# Patient Record
Sex: Female | Born: 1995 | Race: Black or African American | Hispanic: No | Marital: Single | Smoking: Never smoker
Health system: Southern US, Community
[De-identification: ages and names within clinical notes are randomized; demographics above are authoritative.]

## PROBLEM LIST (undated history)

## (undated) HISTORY — PX: INDUCED ABORTION: SHX677

---

## 2019-07-17 ENCOUNTER — Emergency Department (HOSPITAL_COMMUNITY)
Admission: EM | Admit: 2019-07-17 | Discharge: 2019-07-17 | Disposition: A | Discharge status: Home / Self Care | Payer: Self-pay | Attending: Emergency Medicine | Admitting: Emergency Medicine

## 2019-07-17 ENCOUNTER — Emergency Department (HOSPITAL_COMMUNITY): Payer: Self-pay

## 2019-07-17 ENCOUNTER — Other Ambulatory Visit: Payer: Self-pay

## 2019-07-17 ENCOUNTER — Encounter (HOSPITAL_COMMUNITY): Payer: Self-pay | Admitting: Emergency Medicine

## 2019-07-17 DIAGNOSIS — R110 Nausea: Secondary | ICD-10-CM | POA: Insufficient documentation

## 2019-07-17 DIAGNOSIS — T185XXA Foreign body in anus and rectum, initial encounter: Secondary | ICD-10-CM | POA: Insufficient documentation

## 2019-07-17 DIAGNOSIS — Y929 Unspecified place or not applicable: Secondary | ICD-10-CM | POA: Insufficient documentation

## 2019-07-17 DIAGNOSIS — F419 Anxiety disorder, unspecified: Secondary | ICD-10-CM | POA: Insufficient documentation

## 2019-07-17 DIAGNOSIS — Y999 Unspecified external cause status: Secondary | ICD-10-CM | POA: Insufficient documentation

## 2019-07-17 DIAGNOSIS — Y9389 Activity, other specified: Secondary | ICD-10-CM | POA: Insufficient documentation

## 2019-07-17 DIAGNOSIS — X58XXXA Exposure to other specified factors, initial encounter: Secondary | ICD-10-CM | POA: Insufficient documentation

## 2019-07-17 LAB — COMPREHENSIVE METABOLIC PANEL
ALT: 12 U/L (ref 0–44)
AST: 23 U/L (ref 15–41)
Albumin: 4.2 g/dL (ref 3.5–5.0)
Alkaline Phosphatase: 67 U/L (ref 38–126)
Anion gap: 10 (ref 5–15)
BUN: 13 mg/dL (ref 6–20)
CO2: 25 mmol/L (ref 22–32)
Calcium: 9.6 mg/dL (ref 8.9–10.3)
Chloride: 103 mmol/L (ref 98–111)
Creatinine, Ser: 0.77 mg/dL (ref 0.44–1.00)
GFR calc Af Amer: >60 mL/min (ref >60)
GFR calc non Af Amer: >60 mL/min (ref >60)
Glucose, Bld: 96 mg/dL (ref 70–99)
Potassium: 3.7 mmol/L (ref 3.5–5.1)
Sodium: 138 mmol/L (ref 135–145)
Total Bilirubin: 0.3 mg/dL (ref 0.3–1.2)
Total Protein: 7 g/dL (ref 6.5–8.1)

## 2019-07-17 LAB — CBC
HCT: 38.9 % (ref 36.0–46.0)
Hemoglobin: 11.7 g/dL — ABNORMAL LOW (ref 12.0–15.0)
MCH: 22.7 pg — ABNORMAL LOW (ref 26.0–34.0)
MCHC: 30.1 g/dL (ref 30.0–36.0)
MCV: 75.5 fL — ABNORMAL LOW (ref 80.0–100.0)
Platelets: 353 10*3/uL (ref 150–400)
RBC: 5.15 MIL/uL — ABNORMAL HIGH (ref 3.87–5.11)
RDW: 15.3 % (ref 11.5–15.5)
WBC: 5.4 10*3/uL (ref 4.0–10.5)
nRBC: 0 % (ref 0.0–0.2)

## 2019-07-17 LAB — URINALYSIS, ROUTINE W REFLEX MICROSCOPIC
Bilirubin Urine: NEGATIVE
Glucose, UA: NEGATIVE mg/dL
Hgb urine dipstick: NEGATIVE
Ketones, ur: NEGATIVE mg/dL
Leukocytes,Ua: NEGATIVE
Nitrite: NEGATIVE
Protein, ur: NEGATIVE mg/dL
Specific Gravity, Urine: 1.026 (ref 1.005–1.030)
pH: 5 (ref 5.0–8.0)

## 2019-07-17 LAB — I-STAT BETA HCG BLOOD, ED (MC, WL, AP ONLY): I-stat hCG, quantitative: <5 m[IU]/mL (ref <5)

## 2019-07-17 LAB — LIPASE, BLOOD: Lipase: 21 U/L (ref 11–51)

## 2019-07-17 MED ORDER — SODIUM CHLORIDE 0.9% FLUSH: 3.0000 mL | Freq: Once | INTRAVENOUS | Status: DC

## 2019-07-17 MED ORDER — LIDOCAINE HCL URETHRAL/MUCOSAL 2 % EX GEL
1.0000 "application " | Freq: Once | CUTANEOUS | Status: AC
  Administered 2019-07-17: 1 via TOPICAL
  Filled 2019-07-17: qty 20

## 2019-07-17 MED ORDER — LORAZEPAM 2 MG/ML IJ SOLN
1.0000 mg | Freq: Once | INTRAMUSCULAR | Status: AC
  Administered 2019-07-17: 1 mg via INTRAVENOUS
  Filled 2019-07-17: qty 1

## 2019-07-17 MED ORDER — FENTANYL CITRATE (PF) 100 MCG/2ML IJ SOLN
100.0000 ug | Freq: Once | INTRAMUSCULAR | Status: AC
  Administered 2019-07-17: 09:00:00 100 ug via INTRAVENOUS
  Filled 2019-07-17: qty 2

## 2019-07-17 NOTE — ED Triage Notes (Signed)
Pt reports lower abdominal pain that started around 3am.  No other symptoms at this time.  Pt reports anxiety as well.

## 2019-07-17 NOTE — ED Notes (Signed)
Pt verbalized understanding of discharge paperwork and follow-up care.  °

## 2019-07-17 NOTE — ED Notes (Addendum)
Pt requested RN come back into the room.  She stated that she was having "relations and the butt plug got stuck in me."  RN spoke to Provider who gave verbal orders for Xray.

## 2019-07-17 NOTE — Discharge Instructions (Addendum)
Return for abdominal pain, rectal pain bleeding discharge, fever vomiting

## 2019-07-17 NOTE — ED Provider Notes (Signed)
[facility] EMERGENCY DEPARTMENT Provider Note   CSN: [id number] Arrival date & time: 07/17/19  0417     History Chief Complaint  Patient presents with  . Abdominal Pain    Jasmine Thompson is a 24 y.o. female presents to ER for evaluation of foreign body in her rectum.  She was with her boyfriend and inserted a but plug in her rectum and was unable to remove it.  This happened around 3:30 AM.  Her last bowel movement was yesterday.  She has been passing gas since incident.  Reports associated mild lower suprapubic abdominal discomfort and nausea.  Admits to being anxious due to this.  Denies any significant rectal pain, rectal bleeding or discharge.  No interventions.  HPI     History reviewed. No pertinent past medical history.  There are no problems to display for this patient.   History reviewed. No pertinent surgical history.   OB History   No obstetric history on file.     No family history on file.  Social History   Tobacco Use  . Smoking status: Not on file  Substance Use Topics  . Alcohol use: Not on file  . Drug use: Not on file    Home Medications Prior to Admission medications   Not on File    Allergies    Patient has no known allergies.  Review of Systems   Review of Systems  Genitourinary:       FB in rectum   All other systems reviewed and are negative.   Physical Exam Updated Vital Signs BP 127/84   Pulse 79   Temp 98.3 F (36.8 C) (Oral)   Resp 16   SpO2 100%   Physical Exam Constitutional:      Appearance: She is well-developed.  HENT:     Head: Normocephalic.     Nose: Nose normal.  Eyes:     General: Lids are normal.  Cardiovascular:     Rate and Rhythm: Normal rate.  Pulmonary:     Effort: Pulmonary effort is normal. No respiratory distress.  Abdominal:     Comments: Soft, no distention.  No guarding or rigidity.  Mild suprapubic tenderness.  No CVA tenderness.  Active bowel sounds to lower  quadrants.  Genitourinary:    Comments:  Perianal skin normal, one small external hemorrhoid.  Palpable foreign body with DRE, good rectal tone.  Musculoskeletal:        General: Normal range of motion.     Cervical back: Normal range of motion.  Neurological:     Mental Status: She is alert.  Psychiatric:        Behavior: Behavior normal.     ED Results / Procedures / Treatments   Labs (all labs ordered are listed, but only abnormal results are displayed) Labs Reviewed  CBC - Abnormal; Notable for the following components:      Result Value   RBC 5.15 (*)    Hemoglobin 11.7 (*)    MCV 75.5 (*)    MCH 22.7 (*)    All other components within normal limits  LIPASE, BLOOD  COMPREHENSIVE METABOLIC PANEL  URINALYSIS, ROUTINE W REFLEX MICROSCOPIC  I-STAT BETA HCG BLOOD, ED ([facility], [facility], [facility] ONLY)    EKG None  Radiology DG Abdomen 1 View  Result Date: 07/17/2019 CLINICAL DATA:  Foreign body removal. EXAM: ABDOMEN - 1 VIEW COMPARISON:  None. FINDINGS: Interval removal of the radiopaque foreign body from the rectum. Normal bowel gas  pattern. IMPRESSION: Interval removal of radiopaque foreign body from the rectum. Electronically Signed   By: Kerby Moors M.D.   On: 07/17/2019 10:49   DG Abdomen 1 View  Result Date: 07/17/2019 CLINICAL DATA:  Foreign object in rectum. EXAM: ABDOMEN - 1 VIEW COMPARISON:  No pertinent prior studies available for comparison. FINDINGS: Radiopaque foreign body projects in the region of the rectum, consistent with the provided history. No dilated loops of bowel are demonstrated to suggest obstruction. Moderate stool burden throughout the colon. No acute bony abnormality. IMPRESSION: Radiopaque foreign body projects in the region of the rectum, consistent with the provided history. No evidence of bowel obstruction. Moderate stool burden throughout the colon. Electronically Signed   By: Kellie Simmering DO   On: 07/17/2019 06:34    Procedures .Foreign Body  Removal  Date/Time: 07/17/2019 9:55 AM Performed by: Kinnie Feil, PA-C Authorized by: Kinnie Feil, PA-C  Consent: Verbal consent obtained. Written consent not obtained. Risks and benefits: risks, benefits and alternatives were discussed Consent given by: patient Patient understanding: patient states understanding of the procedure being performed Patient consent: the patient's understanding of the procedure matches consent given Relevant documents: relevant documents present and verified Test results: test results available and properly labeled Imaging studies: imaging studies available Required items: required blood products, implants, devices, and special equipment available Patient identity confirmed: arm band Time out: Immediately prior to procedure a "time out" was called to verify the correct patient, procedure, equipment, support staff and site/side marked as required. Body area: anus Anesthesia method: lidocaine jelly.  Anesthesia: Local Anesthetic: topical anesthetic  Sedation: Patient sedated: no  Patient restrained: no Patient cooperative: yes Localization method: visualized, serial x-rays and probed Removal mechanism: digital extraction Complexity: simple 1 objects recovered. Objects recovered: 1 Post-procedure assessment: foreign body removed Patient tolerance: patient tolerated the procedure well with no immediate complications Comments: Fentanyl and ativan ordered prior to exam    (including critical care time)  Medications Ordered in ED Medications  sodium chloride flush (NS) 0.9 % injection 3 mL (3 mLs Intravenous Not Given 07/17/19 0743)  lidocaine (XYLOCAINE) 2 % jelly 1 application (1 application Topical Given 07/17/19 0909)  fentaNYL (SUBLIMAZE) injection 100 mcg (100 mcg Intravenous Given 07/17/19 0904)  LORazepam (ATIVAN) injection 1 mg (1 mg Intravenous Given 07/17/19 5170)    ED Course  I have reviewed the triage vital signs and the nursing  notes.  Pertinent labs & imaging results that were available during my care of the patient were reviewed by me and considered in my medical decision making (see chart for details).    MDM Rules/Calculators/A&P                      Foreign body in rectum since 3:30 AM today.  Reports mild suprapubic tenderness on exam.  No other concerning associated symptoms.  ER work-up initiated in triage, personally reviewed.  X-ray confirms foreign body in rectum without complicating features.  Foreign body removed in the ER without immediate complications.  Repeat x-ray without abnormalities confirms complete removal.  Will DC with symptomatic management of any rectal discomfort, return precautions discussed.  She is comfortable this.   Final Clinical Impression(s) / ED Diagnoses Final diagnoses:  Foreign body of rectum, initial encounter    Rx / DC Orders ED Discharge Orders    None       Kinnie Feil, PA-C 07/17/19 1055    Blanchie Dessert, MD 07/17/19 1952

## 2019-07-27 ENCOUNTER — Ambulatory Visit: Payer: Self-pay | Attending: Internal Medicine

## 2019-07-27 DIAGNOSIS — Z20822 Contact with and (suspected) exposure to covid-19: Secondary | ICD-10-CM | POA: Insufficient documentation

## 2019-07-29 LAB — NOVEL CORONAVIRUS, NAA: SARS-CoV-2, NAA: NOT DETECTED

## 2020-06-22 ENCOUNTER — Emergency Department (HOSPITAL_COMMUNITY): Payer: No Typology Code available for payment source

## 2020-06-22 ENCOUNTER — Emergency Department (HOSPITAL_COMMUNITY)
Admission: EM | Admit: 2020-06-22 | Discharge: 2020-06-22 | Disposition: A | Discharge status: Home / Self Care | Payer: No Typology Code available for payment source | Attending: Emergency Medicine | Admitting: Emergency Medicine

## 2020-06-22 DIAGNOSIS — S82892A Other fracture of left lower leg, initial encounter for closed fracture: Secondary | ICD-10-CM | POA: Diagnosis not present

## 2020-06-22 DIAGNOSIS — Y990 Civilian activity done for income or pay: Secondary | ICD-10-CM | POA: Insufficient documentation

## 2020-06-22 DIAGNOSIS — S8992XA Unspecified injury of left lower leg, initial encounter: Secondary | ICD-10-CM | POA: Diagnosis present

## 2020-06-22 DIAGNOSIS — W230XXA Caught, crushed, jammed, or pinched between moving objects, initial encounter: Secondary | ICD-10-CM | POA: Insufficient documentation

## 2020-06-22 MED ORDER — HYDROCODONE-ACETAMINOPHEN 5-325 MG PO TABS: 1.0000 | ORAL_TABLET | Freq: Four times a day (QID) | ORAL | 0 refills | Status: AC | PRN

## 2020-06-22 MED ORDER — HYDROCODONE-ACETAMINOPHEN 5-325 MG PO TABS
1.0000 | ORAL_TABLET | Freq: Once | ORAL | Status: AC
  Administered 2020-06-22: 1 via ORAL
  Filled 2020-06-22: qty 1

## 2020-06-22 NOTE — ED Notes (Signed)
Patient verbalizes understanding of discharge instructions. Opportunity for questioning and answers were provided. Armband removed by staff, pt discharged from ED in wheelchair to lobby while pt awaits transportation.

## 2020-06-22 NOTE — ED Provider Notes (Signed)
[facility] EMERGENCY DEPARTMENT Provider Note   CSN: [id number] Arrival date & time: 06/22/20  1747     History Chief Complaint  Patient presents with  . Ankle Pain    Jasmine Thompson is a 24 y.o. female.  HPI Patient presents with acute onset left ankle pain. Patient was working at a [employer] education [employer] when her ankle was caught between a Neurosurgeon and a pallet. She feels acute onset of pain.  Since that time she has been nonambulatory secondary to pain.  Pain is worse with motion.  No other injuries, no other complaints. She presents via EMS. She received 100 mcg of fentanyl in route, had some improvement in her pain    No past medical history on file.  There are no problems to display for this patient.   No past surgical history on file.   OB History   No obstetric history on file.     No family history on file.  Social History   Tobacco Use  . Smoking status: Not on file  Substance Use Topics  . Alcohol use: Not on file  . Drug use: Not on file    Home Medications Prior to Admission medications   Medication Sig Start Date End Date Taking? Authorizing Provider  HYDROcodone-acetaminophen (NORCO/VICODIN) 5-325 MG tablet Take 1 tablet by mouth every 6 (six) hours as needed for severe pain. 06/22/20   Gerhard Munch, MD    Allergies    Patient has no known allergies.  Review of Systems   Review of Systems  Constitutional:       Per HPI, otherwise negative  HENT:       Per HPI, otherwise negative  Respiratory:       Per HPI, otherwise negative  Cardiovascular:       Per HPI, otherwise negative  Gastrointestinal: Negative for vomiting.  Endocrine:       Negative aside from HPI  Genitourinary:       Neg aside from HPI   Musculoskeletal:       Per HPI, otherwise negative  Skin: Positive for wound.  Neurological: Negative for syncope.    Physical Exam Updated Vital Signs BP (!) 129/92 (BP Location: Left Arm)    Pulse 80   Temp 98.4 F (36.9 C) (Oral)   Resp 18   LMP 05/23/2020   SpO2 100%   Physical Exam Vitals and nursing note reviewed.  Constitutional:      General: She is not in acute distress.    Appearance: She is well-developed.  HENT:     Head: Normocephalic and atraumatic.  Eyes:     Conjunctiva/sclera: Conjunctivae normal.  Cardiovascular:     Rate and Rhythm: Normal rate and regular rhythm.  Pulmonary:     Effort: Pulmonary effort is normal. No respiratory distress.     Breath sounds: Normal breath sounds. No stridor.  Abdominal:     General: There is no distension.  Musculoskeletal:       Legs:  Skin:    General: Skin is warm and dry.  Neurological:     Mental Status: She is alert and oriented to person, place, and time.     Cranial Nerves: No cranial nerve deficit.     ED Results / Procedures / Treatments   Labs (all labs ordered are listed, but only abnormal results are displayed) Labs Reviewed - No data to display  EKG None  Radiology DG Tibia/Fibula Left  Result Date: 06/22/2020 CLINICAL  DATA:  Ankle injury EXAM: LEFT TIBIA AND FIBULA - 2 VIEW COMPARISON:  06/22/2020 FINDINGS: Acute nondisplaced spiral fracture involving the distal shaft of the tibia. Proximal fibula and tibial appear intact. Soft tissue swelling is present. IMPRESSION: Acute nondisplaced distal tibial fracture Electronically Signed   By: Jasmine Pang M.D.   On: 06/22/2020 22:26   DG Ankle Complete Left  Result Date: 06/22/2020 CLINICAL DATA:  Pain EXAM: LEFT ANKLE COMPLETE - 3+ VIEW COMPARISON:  None. FINDINGS: There is an acute, nondisplaced, oblique fracture through the distal tibia. There is no dislocation. There is significant soft tissue swelling about the ankle. There is no definite disruption of the ankle mortise. IMPRESSION: Acute, nondisplaced, oblique fracture through the distal tibia with significant surrounding soft tissue swelling. Electronically Signed   By: Katherine Mantle M.D.   On: 06/22/2020 18:53    Procedures Procedures (including critical care time)  Medications Ordered in ED Medications  HYDROcodone-acetaminophen (NORCO/VICODIN) 5-325 MG per tablet 1 tablet (1 tablet Oral Given 06/22/20 2208)    ED Course  I have reviewed the triage vital signs and the nursing notes.  Pertinent labs & imaging results that were available during my care of the patient were reviewed by me and considered in my medical decision making (see chart for details).    MDM Rules/Calculators/A&P                          11:42 PM On repeat exam the patient is in similar condition. She is receiving a splint for immobilization. I discussed the case with our orthopedic surgeon, Dr. Jena Gauss to ensure appropriate outpatient care.  Young female presents after mechanical injury sustained at work.  She is otherwise well, but has ankle pain and is found to have spiral fracture of distal tibia/ankle fracture. Patient is otherwise in no distress, had appropriate normalization and after discussion with orthopedics was discharged in stable condition. Final Clinical Impression(s) / ED Diagnoses Final diagnoses:  Closed fracture of left ankle, initial encounter    Rx / DC Orders ED Discharge Orders         Ordered    HYDROcodone-acetaminophen (NORCO/VICODIN) 5-325 MG tablet  Every 6 hours PRN        06/22/20 2341           Gerhard Munch, MD 06/22/20 2343

## 2020-06-22 NOTE — ED Notes (Signed)
Pt transported to [facility] at this time.

## 2020-06-22 NOTE — ED Triage Notes (Signed)
Patient BIB [facility] for left ankle injury after having her left ankle pinned between a support pole and pallet jack. 20g saline lock in right AC, received 100 mcg fentanyl en route. Left ankle in splint from EMS, wound noted on lateral surface of left ankle.

## 2020-06-22 NOTE — ED Notes (Signed)
Ortho Technician at Bedside.

## 2020-06-22 NOTE — Progress Notes (Signed)
Orthopedic Tech Progress Note Patient Details:  Jasmine Thompson 25-Oct-1995 [id number]  Ortho Devices Type of Ortho Device: Post (short leg) splint, Stirrup splint, Crutches Splint Material: Fiberglass Ortho Device/Splint Location: Left Lower Extremity Ortho Device/Splint Interventions: Ordered, Application, Adjustment   Post Interventions Patient Tolerated: Well Instructions Provided: Adjustment of device, Care of device, Poper ambulation with device   [name] P Harle Stanford 06/22/2020, 11:46 PM

## 2020-06-22 NOTE — Discharge Instructions (Signed)
As discussed, you have been diagnosed with an ankle fracture.  The far end of your shin bone or tibia has a spiral fracture in it.  It is important that you follow-up with orthopedic surgeon colleague for appropriate ongoing care.  Return here for concerning changes in your condition.

## 2021-01-26 IMAGING — CR DG TIBIA/FIBULA 2V*L*
4 series · 4 of 4 positions shown · non-contrast
Comparison: 06/22/2020

CLINICAL DATA: Ankle injury

EXAM:
LEFT TIBIA AND FIBULA - 2 VIEW

[tibia ap (1 of 2)]
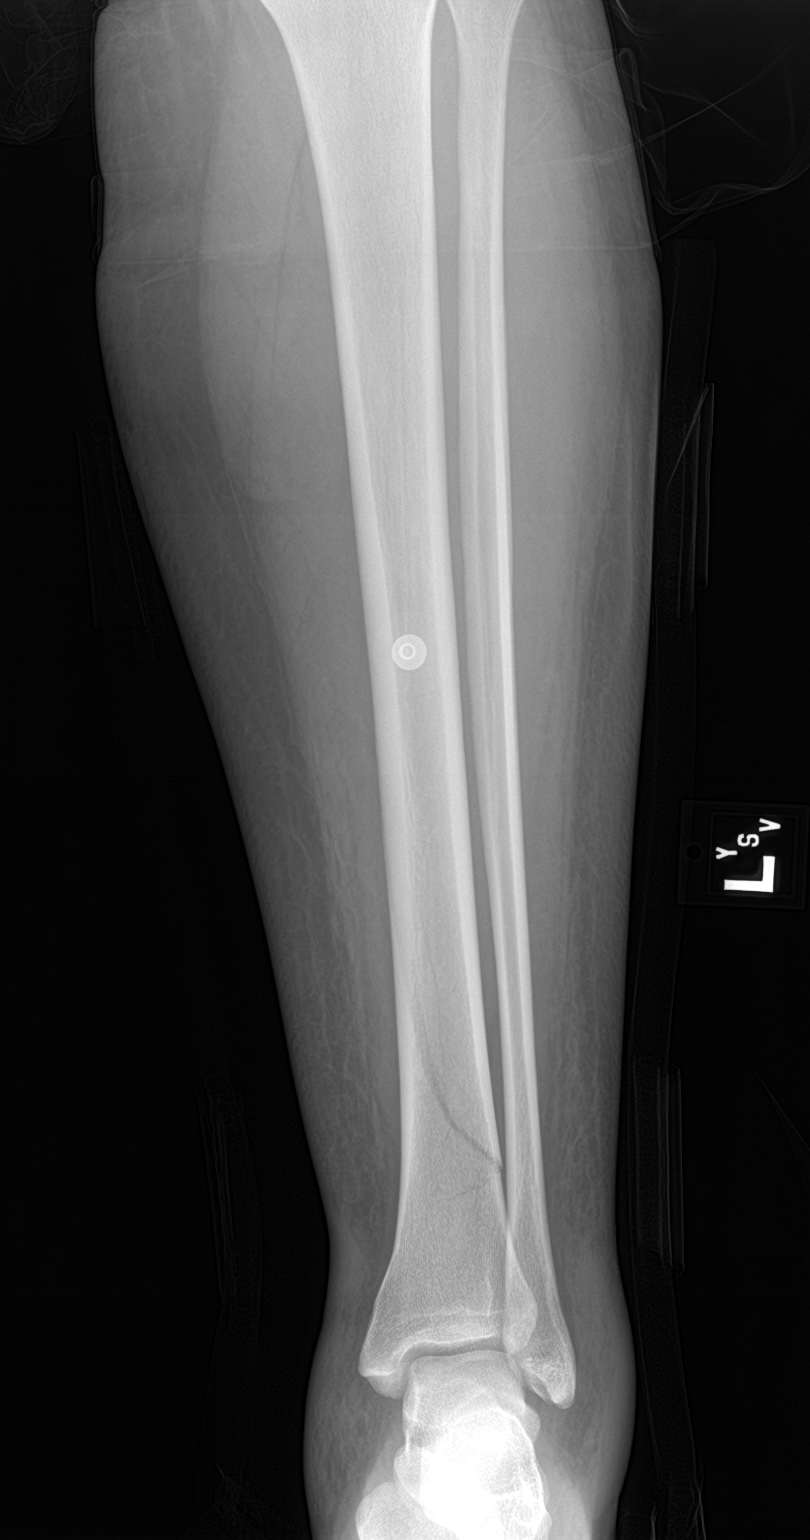

[tibia ap (2 of 2)]
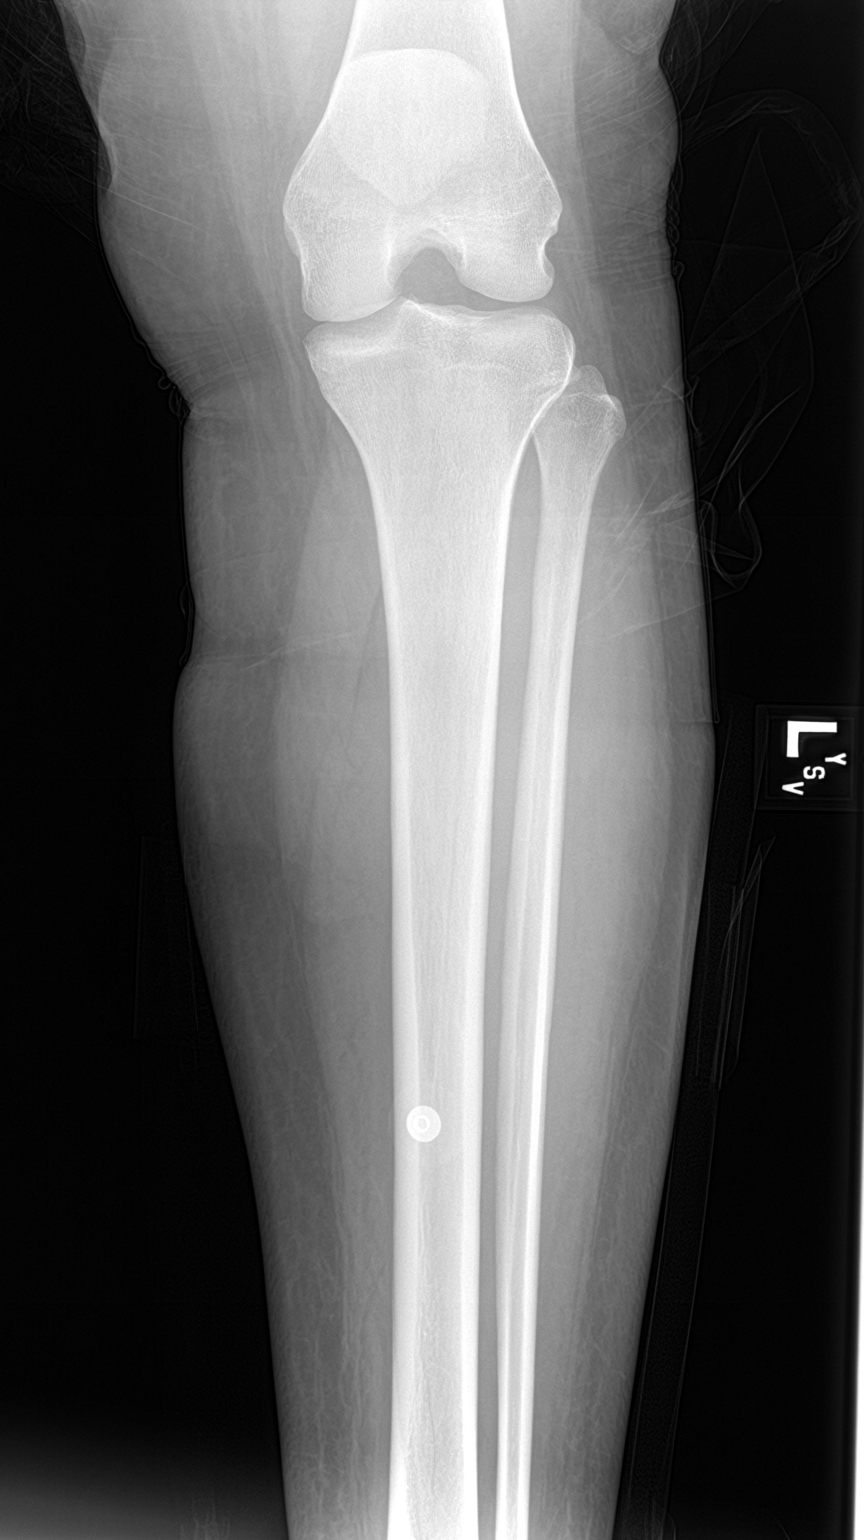

[tibia lat (1 of 2)]
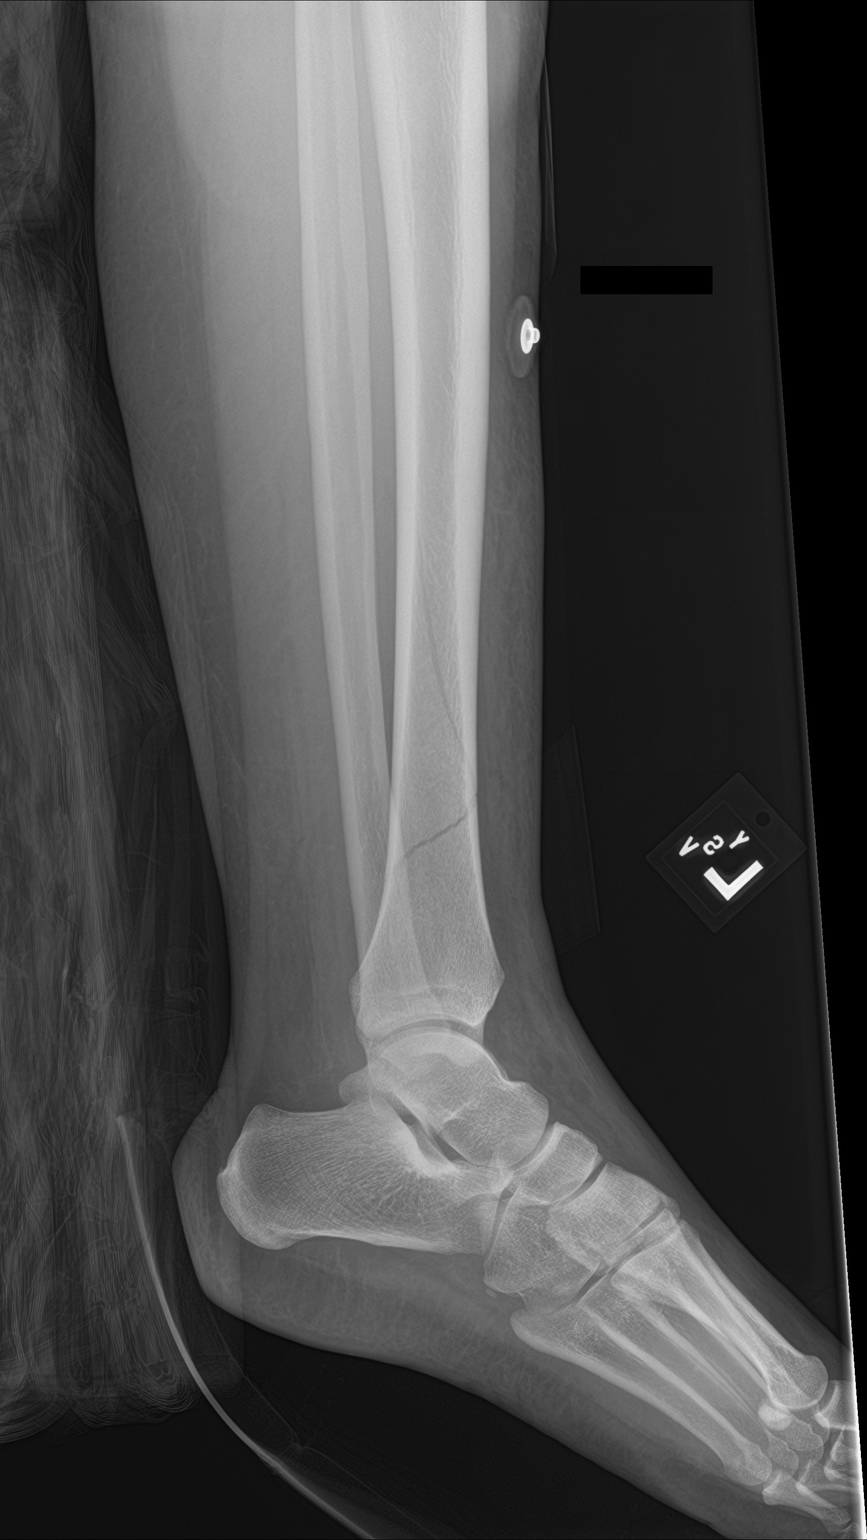

[tibia lat (2 of 2)]
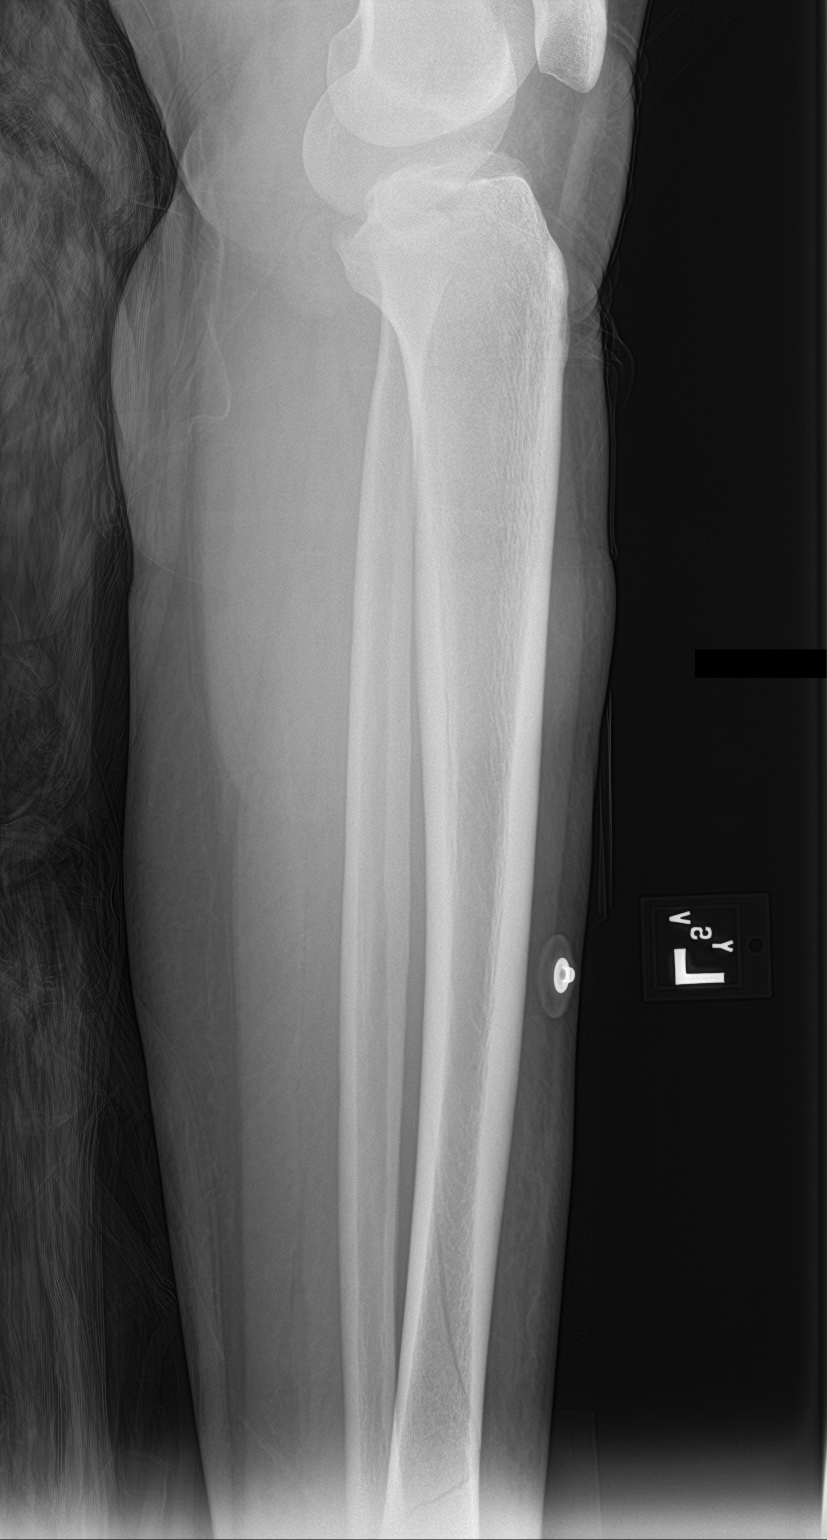

[4 of 4 positions shown; findings below may reference images not displayed]

FINDINGS: Acute nondisplaced spiral fracture involving the distal shaft of the
tibia. Proximal fibula and tibial appear intact. Soft tissue
swelling is present.
IMPRESSION: Acute nondisplaced distal tibial fracture

## 2021-03-08 ENCOUNTER — Other Ambulatory Visit: Payer: Self-pay

## 2021-03-08 ENCOUNTER — Emergency Department (HOSPITAL_BASED_OUTPATIENT_CLINIC_OR_DEPARTMENT_OTHER)
Admission: EM | Admit: 2021-03-08 | Discharge: 2021-03-09 | Disposition: A | Discharge status: Home / Self Care | Payer: Managed Care, Other (non HMO) | Attending: Emergency Medicine | Admitting: Emergency Medicine

## 2021-03-08 ENCOUNTER — Encounter (HOSPITAL_BASED_OUTPATIENT_CLINIC_OR_DEPARTMENT_OTHER): Payer: Self-pay | Admitting: *Deleted

## 2021-03-08 DIAGNOSIS — N939 Abnormal uterine and vaginal bleeding, unspecified: Secondary | ICD-10-CM | POA: Diagnosis present

## 2021-03-08 LAB — URINALYSIS, ROUTINE W REFLEX MICROSCOPIC

## 2021-03-08 LAB — URINALYSIS, MICROSCOPIC (REFLEX): RBC / HPF: >50 RBC/hpf (ref 0–5)

## 2021-03-08 LAB — PREGNANCY, URINE: Preg Test, Ur: NEGATIVE

## 2021-03-08 NOTE — ED Provider Notes (Signed)
[facility] EMERGENCY DEPARTMENT Provider Note   CSN: [id number] Arrival date & time: 03/08/21  2152     History Chief Complaint  Patient presents with  . Vaginal Bleeding    Jasmine Thompson is a 25 y.o. female.  The history is provided by the patient.  Vaginal Bleeding Quality:  Dark red Severity:  Mild Onset quality:  Gradual Duration:  5 days Timing:  Intermittent Progression:  Unchanged Chronicity:  New Menstrual history:  Irregular Number of pads used:  2 a day Number of tampons used:  None Possible pregnancy: no   Context: not during urination   Relieved by:  Nothing Worsened by:  Nothing Ineffective treatments:  None tried Associated symptoms: no abdominal pain, no dyspareunia and no fever   Risk factors: no ovarian torsion and no STD       History reviewed. No pertinent past medical history.  There are no problems to display for this patient.   Past Surgical History:  Procedure Laterality Date  . INDUCED ABORTION       OB History   No obstetric history on file.     History reviewed. No pertinent family history.  Social History   Tobacco Use  . Smoking status: Never  . Smokeless tobacco: Never  Vaping Use  . Vaping Use: Never used  Substance Use Topics  . Alcohol use: Yes    Comment: soc  . Drug use: Not Currently    Home Medications Prior to Admission medications   Medication Sig Start Date End Date Taking? Authorizing Provider  HYDROcodone-acetaminophen (NORCO/VICODIN) 5-325 MG tablet Take 1 tablet by mouth every 6 (six) hours as needed for severe pain. 06/22/20   Gerhard Munch, MD    Allergies    Patient has no known allergies.  Review of Systems   Review of Systems  Constitutional:  Negative for fever.  HENT:  Negative for facial swelling.   Eyes:  Negative for redness.  Respiratory:  Negative for wheezing.   Cardiovascular:  Negative for leg swelling.  Gastrointestinal:  Negative for abdominal pain.   Genitourinary:  Positive for vaginal bleeding. Negative for dyspareunia.  Musculoskeletal:  Negative for arthralgias.  Skin:  Negative for wound.  Neurological:  Negative for facial asymmetry.  Psychiatric/Behavioral:  Negative for agitation.   All other systems reviewed and are negative.  Physical Exam Updated Vital Signs BP 131/89 (BP Location: Right Arm)   Pulse 88   Temp 98.9 F (37.2 C) (Oral)   Resp 18   Ht 5\' 4"  (1.626 m)   Wt 81.6 kg   LMP 02/10/2021   SpO2 100%   BMI 30.90 kg/m   Physical Exam Vitals and nursing note reviewed. Exam conducted with a chaperone present.  Constitutional:      Appearance: Normal appearance.  HENT:     Head: Normocephalic and atraumatic.     Nose: Nose normal.  Eyes:     Conjunctiva/sclera: Conjunctivae normal.     Pupils: Pupils are equal, round, and reactive to light.  Cardiovascular:     Rate and Rhythm: Normal rate and regular rhythm.     Pulses: Normal pulses.     Heart sounds: Normal heart sounds.  Pulmonary:     Effort: Pulmonary effort is normal.     Breath sounds: Normal breath sounds.  Abdominal:     General: Abdomen is flat. Bowel sounds are normal.     Palpations: Abdomen is soft.     Tenderness: There is no abdominal tenderness.  There is no guarding.  Genitourinary:    General: Normal vulva.     Cervix: No cervical motion tenderness or friability.     Adnexa:        Right: No mass or tenderness.         Left: No mass or tenderness.       Comments: Scant blood at os.  Nothing in vault, no trauma  Musculoskeletal:        General: Normal range of motion.     Cervical back: Normal range of motion and neck supple.  Skin:    General: Skin is warm and dry.     Capillary Refill: Capillary refill takes less than 2 seconds.  Neurological:     General: No focal deficit present.     Mental Status: She is alert and oriented to person, place, and time.     Deep Tendon Reflexes: Reflexes normal.  Psychiatric:        Mood  and Affect: Mood normal.        Behavior: Behavior normal.    ED Results / Procedures / Treatments   Labs (all labs ordered are listed, but only abnormal results are displayed) Labs Reviewed  URINALYSIS, ROUTINE W REFLEX MICROSCOPIC - Abnormal; Notable for the following components:      Result Value   Color, Urine RED (*)    APPearance TURBID (*)    Glucose, UA   (*)    Value: TEST NOT REPORTED DUE TO COLOR INTERFERENCE OF URINE PIGMENT   Hgb urine dipstick   (*)    Value: TEST NOT REPORTED DUE TO COLOR INTERFERENCE OF URINE PIGMENT   Bilirubin Urine   (*)    Value: TEST NOT REPORTED DUE TO COLOR INTERFERENCE OF URINE PIGMENT   Ketones, ur   (*)    Value: TEST NOT REPORTED DUE TO COLOR INTERFERENCE OF URINE PIGMENT   Protein, ur   (*)    Value: TEST NOT REPORTED DUE TO COLOR INTERFERENCE OF URINE PIGMENT   Nitrite   (*)    Value: TEST NOT REPORTED DUE TO COLOR INTERFERENCE OF URINE PIGMENT   Leukocytes,Ua   (*)    Value: TEST NOT REPORTED DUE TO COLOR INTERFERENCE OF URINE PIGMENT   All other components within normal limits  URINALYSIS, MICROSCOPIC (REFLEX) - Abnormal; Notable for the following components:   Bacteria, UA RARE (*)    All other components within normal limits  PREGNANCY, URINE    EKG None  Radiology No results found.  Procedures Procedures   Medications Ordered in ED Medications - No data to display  ED Course  I have reviewed the triage vital signs and the nursing notes.  Pertinent labs & imaging results that were available during my care of the patient were reviewed by me and considered in my medical decision making (see chart for details).   Will have patient follow up with [facility] for pap smear and further testing.  2 pads a day for 5 days is not excessive.  No indication for labs at this time.  No tenderness.  No fevers no discharge.  Stable for discharge with close follow up.   Jasmine Thompson was evaluated in Emergency Department on  03/09/2021 for the symptoms described in the history of present illness. She was evaluated in the context of the global COVID-19 pandemic, which necessitated consideration that the patient might be at risk for infection with the SARS-CoV-2 virus that causes COVID-19. Institutional protocols and algorithms  that pertain to the evaluation of patients at risk for COVID-19 are in a state of rapid change based on information released by regulatory bodies including the CDC and federal and state organizations. These policies and algorithms were followed during the patient's care in the ED.]  Final Clinical Impression(s) / ED Diagnoses unrelieved by medication, shortness of breath, intractable vomiting, chest pain, shortness of breath, weakness, numbness, changes in speech, facial asymmetry, abdominal pain, passing out, Inability to tolerate liquids or food, cough, altered mental status or any concerns. No signs of systemic illness or infection. The patient is nontoxic-appearing on exam and vital signs are within normal limits. I have reviewed the triage vital signs and the nursing notes. Pertinent labs & imaging results that were available during my care of the patient were reviewed by me and considered in my medical decision making (see chart for details). After history, exam, and medical workup I feel the patient has been appropriately medically screened and is safe for discharge home. Pertinent diagnoses were discussed with the patient. Patient was given return precautions.    Braelynn Benning, MD 03/09/21 4503

## 2021-03-08 NOTE — ED Triage Notes (Signed)
Brought in by EMS from home for vaginal bleeding.

## 2021-03-09 ENCOUNTER — Encounter (HOSPITAL_BASED_OUTPATIENT_CLINIC_OR_DEPARTMENT_OTHER): Payer: Self-pay | Admitting: Emergency Medicine

## 2021-03-09 LAB — WET PREP, GENITAL
Clue Cells Wet Prep HPF POC: NONE SEEN
Sperm: NONE SEEN
Trich, Wet Prep: NONE SEEN
Yeast Wet Prep HPF POC: NONE SEEN

## 2021-03-10 LAB — GC/CHLAMYDIA PROBE AMP (~~LOC~~) NOT AT ARMC
Chlamydia: NEGATIVE
Comment: NEGATIVE
Comment: NORMAL
Neisseria Gonorrhea: NEGATIVE
